# Patient Record
Sex: Female | Born: 1995 | Race: White | Hispanic: No | Marital: Single | Smoking: Never smoker
Health system: Southern US, Community
[De-identification: ages and names within clinical notes are randomized; demographics above are authoritative.]

## PROBLEM LIST (undated history)

## (undated) DIAGNOSIS — E079 Disorder of thyroid, unspecified: Secondary | ICD-10-CM

## (undated) DIAGNOSIS — M419 Scoliosis, unspecified: Secondary | ICD-10-CM

---
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
## 2017-08-13 IMAGING — CR DG ELBOW 2V*R*
1 series · 2 of 2 positions shown · non-contrast
Comparison: None.

CLINICAL DATA: Right elbow pain for 2 months.  No injury.

EXAM:
RIGHT ELBOW - 2 VIEW

[Series 1: dg elbow 2 views right · 0.14mm/px · 2 of 2 slices shown]
[im 1/2]
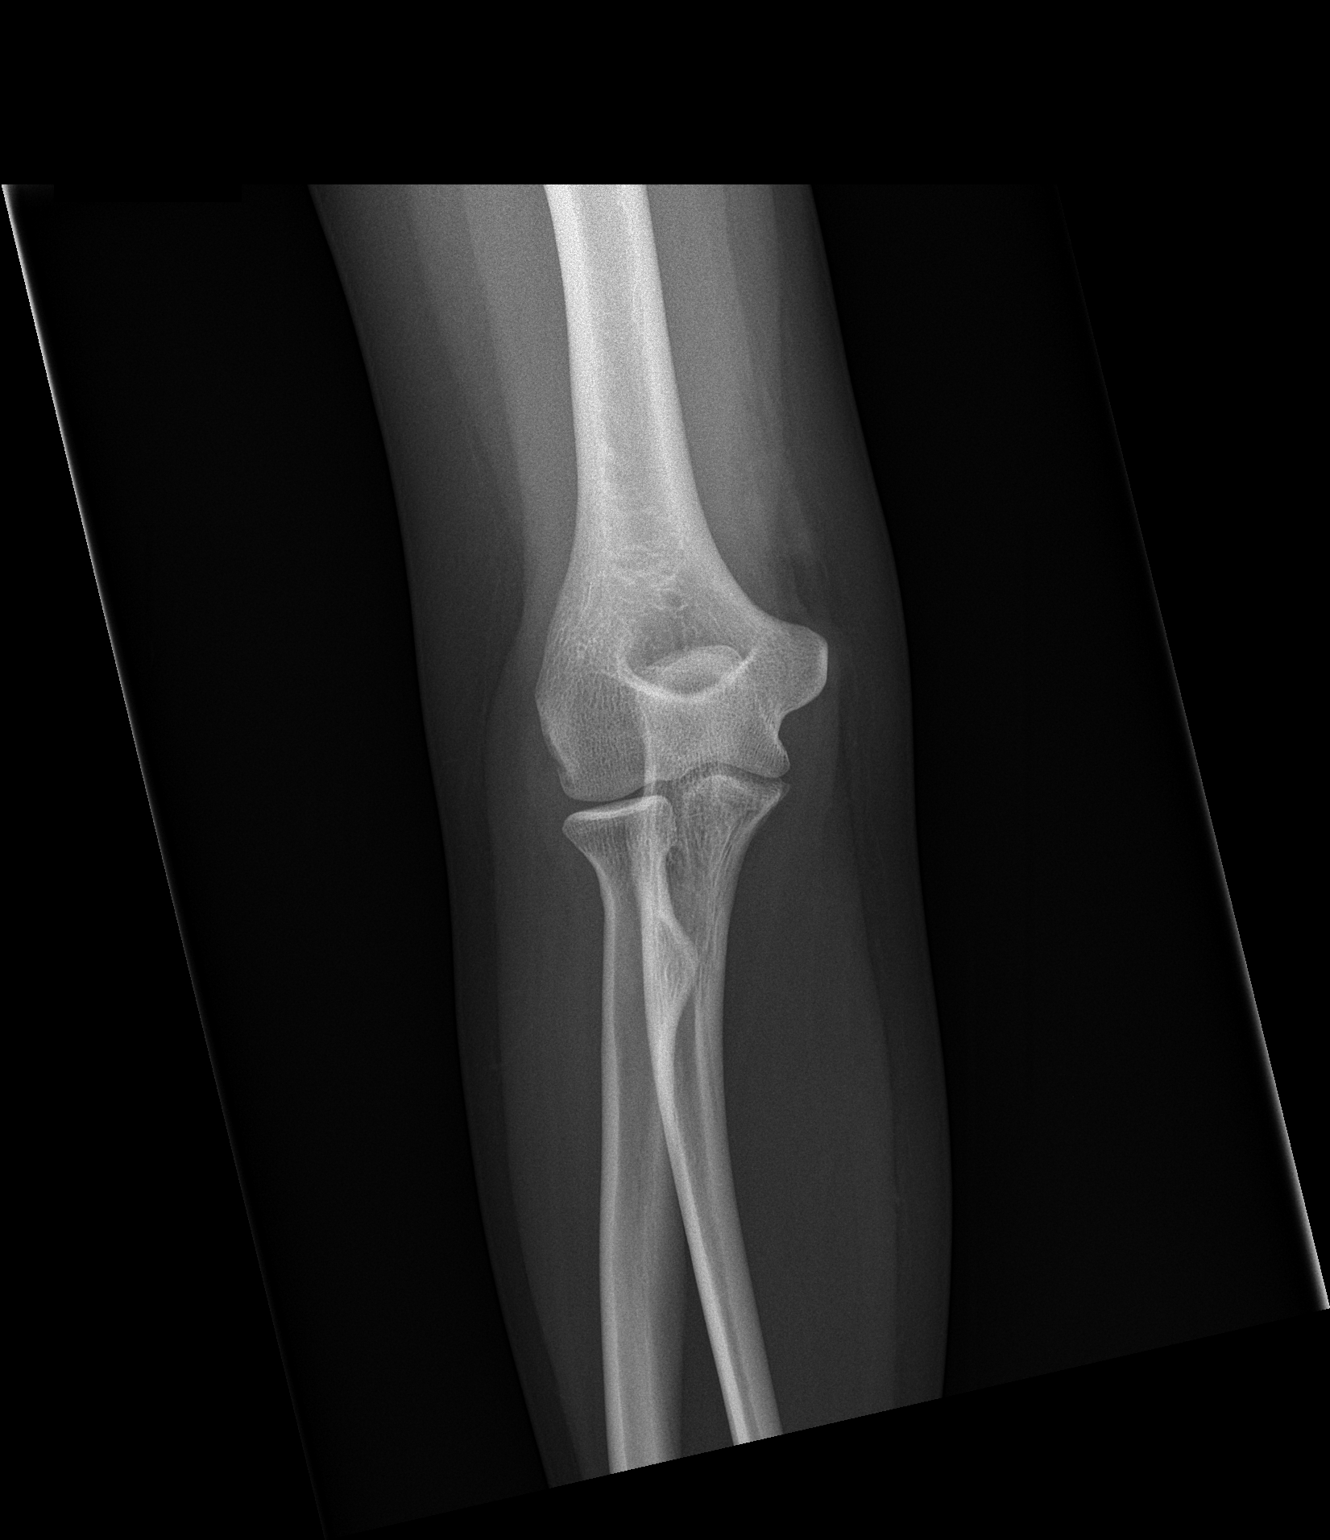
[im 2/2]
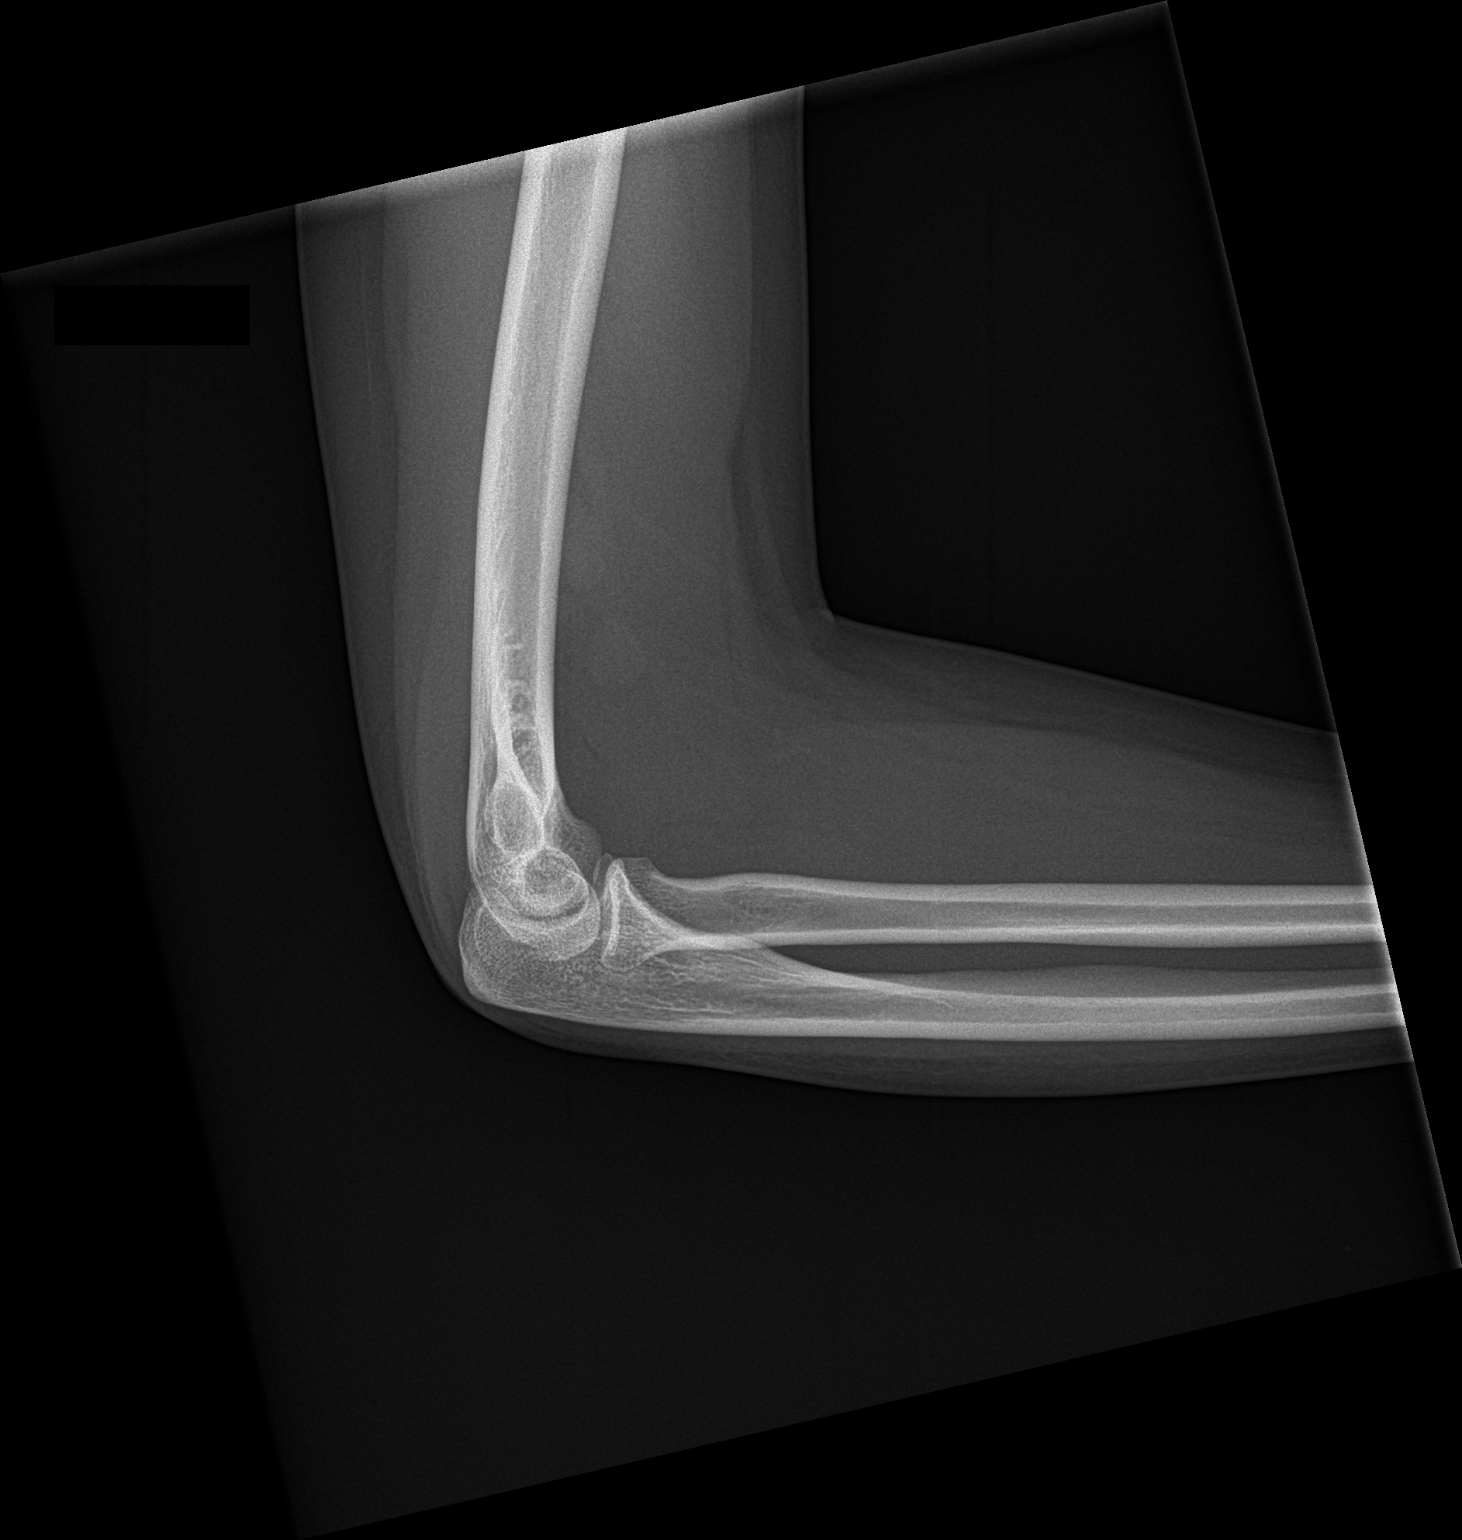

[2 of 2 positions shown; findings below may reference images not displayed]

FINDINGS: There is no evidence of fracture, dislocation, or joint effusion.
There is no evidence of arthropathy or other focal bone abnormality.
Soft tissues are unremarkable.
IMPRESSION: Negative.
